# Patient Record
Sex: Female | Born: 1980 | Race: White | Hispanic: No | Smoking: Former smoker
Health system: Southern US, Community
[De-identification: ages and names within clinical notes are randomized; demographics above are authoritative.]

## PROBLEM LIST (undated history)

## (undated) DIAGNOSIS — F410 Panic disorder [episodic paroxysmal anxiety] without agoraphobia: Secondary | ICD-10-CM

## (undated) DIAGNOSIS — F988 Other specified behavioral and emotional disorders with onset usually occurring in childhood and adolescence: Secondary | ICD-10-CM

## (undated) HISTORY — PX: FOOT SURGERY: SHX648

---

## 2014-01-02 ENCOUNTER — Emergency Department (HOSPITAL_COMMUNITY)
Admission: EM | Admit: 2014-01-02 | Discharge: 2014-01-02 | Disposition: A | Discharge status: Home / Self Care | Payer: Self-pay | Attending: Emergency Medicine | Admitting: Emergency Medicine

## 2014-01-02 ENCOUNTER — Emergency Department (HOSPITAL_COMMUNITY): Payer: Self-pay

## 2014-01-02 ENCOUNTER — Encounter (HOSPITAL_COMMUNITY): Payer: Self-pay | Admitting: Emergency Medicine

## 2014-01-02 DIAGNOSIS — F411 Generalized anxiety disorder: Secondary | ICD-10-CM | POA: Insufficient documentation

## 2014-01-02 DIAGNOSIS — F419 Anxiety disorder, unspecified: Secondary | ICD-10-CM

## 2014-01-02 DIAGNOSIS — Z3202 Encounter for pregnancy test, result negative: Secondary | ICD-10-CM | POA: Insufficient documentation

## 2014-01-02 DIAGNOSIS — Z792 Long term (current) use of antibiotics: Secondary | ICD-10-CM | POA: Insufficient documentation

## 2014-01-02 DIAGNOSIS — Z87891 Personal history of nicotine dependence: Secondary | ICD-10-CM | POA: Insufficient documentation

## 2014-01-02 DIAGNOSIS — Z791 Long term (current) use of non-steroidal anti-inflammatories (NSAID): Secondary | ICD-10-CM | POA: Insufficient documentation

## 2014-01-02 DIAGNOSIS — R109 Unspecified abdominal pain: Secondary | ICD-10-CM | POA: Insufficient documentation

## 2014-01-02 HISTORY — DX: Panic disorder (episodic paroxysmal anxiety): F41.0

## 2014-01-02 HISTORY — DX: Other specified behavioral and emotional disorders with onset usually occurring in childhood and adolescence: F98.8

## 2014-01-02 LAB — HEPATIC FUNCTION PANEL
ALK PHOS: 59 U/L (ref 39–117)
ALT: 11 U/L (ref 0–35)
AST: 17 U/L (ref 0–37)
Albumin: 4.7 g/dL (ref 3.5–5.2)
BILIRUBIN TOTAL: 0.6 mg/dL (ref 0.3–1.2)
Bilirubin, Direct: <0.2 mg/dL (ref 0.0–0.3)
Total Protein: 7.9 g/dL (ref 6.0–8.3)

## 2014-01-02 LAB — CBC WITH DIFFERENTIAL/PLATELET
BASOS ABS: 0 10*3/uL (ref 0.0–0.1)
Basophils Relative: 0 % (ref 0–1)
Eosinophils Absolute: 0.1 10*3/uL (ref 0.0–0.7)
Eosinophils Relative: 1 % (ref 0–5)
HCT: 44.2 % (ref 36.0–46.0)
Hemoglobin: 15.1 g/dL — ABNORMAL HIGH (ref 12.0–15.0)
Lymphocytes Relative: 18 % (ref 12–46)
Lymphs Abs: 1.3 10*3/uL (ref 0.7–4.0)
MCH: 29 pg (ref 26.0–34.0)
MCHC: 34.2 g/dL (ref 30.0–36.0)
MCV: 84.8 fL (ref 78.0–100.0)
Monocytes Absolute: 0.4 10*3/uL (ref 0.1–1.0)
Monocytes Relative: 6 % (ref 3–12)
Neutro Abs: 5.6 10*3/uL (ref 1.7–7.7)
Neutrophils Relative %: 75 % (ref 43–77)
PLATELETS: ADEQUATE 10*3/uL (ref 150–400)
RBC: 5.21 MIL/uL — ABNORMAL HIGH (ref 3.87–5.11)
RDW: 12.6 % (ref 11.5–15.5)
WBC: 7.4 10*3/uL (ref 4.0–10.5)

## 2014-01-02 LAB — LIPASE, BLOOD: Lipase: 19 U/L (ref 11–59)

## 2014-01-02 LAB — URINALYSIS, ROUTINE W REFLEX MICROSCOPIC
Glucose, UA: NEGATIVE mg/dL
Hgb urine dipstick: NEGATIVE
KETONES UR: 40 mg/dL — AB
Leukocytes, UA: NEGATIVE
NITRITE: NEGATIVE
PROTEIN: NEGATIVE mg/dL
Specific Gravity, Urine: 1.015 (ref 1.005–1.030)
UROBILINOGEN UA: 1 mg/dL (ref 0.0–1.0)
pH: 8.5 — ABNORMAL HIGH (ref 5.0–8.0)

## 2014-01-02 LAB — BASIC METABOLIC PANEL
ANION GAP: 13 (ref 5–15)
BUN: 10 mg/dL (ref 6–23)
CO2: 24 mEq/L (ref 19–32)
CREATININE: 0.78 mg/dL (ref 0.50–1.10)
Calcium: 9.7 mg/dL (ref 8.4–10.5)
Chloride: 103 mEq/L (ref 96–112)
GFR calc Af Amer: >90 mL/min (ref >90)
GFR calc non Af Amer: >90 mL/min (ref >90)
Glucose, Bld: 126 mg/dL — ABNORMAL HIGH (ref 70–99)
Potassium: 4.1 mEq/L (ref 3.7–5.3)
Sodium: 140 mEq/L (ref 137–147)

## 2014-01-02 LAB — PREGNANCY, URINE: Preg Test, Ur: NEGATIVE

## 2014-01-02 MED ORDER — ALPRAZOLAM 0.25 MG PO TABS: 0.2500 mg | ORAL_TABLET | Freq: Two times a day (BID) | ORAL | Status: AC | PRN

## 2014-01-02 MED ORDER — SUCRALFATE 1 G PO TABS: 1.0000 g | ORAL_TABLET | Freq: Four times a day (QID) | ORAL | Status: AC

## 2014-01-02 MED ORDER — SODIUM CHLORIDE 0.9 % IV BOLUS (SEPSIS): 1000.0000 mL | Freq: Once | INTRAVENOUS | Status: DC

## 2014-01-02 MED ORDER — SUCRALFATE 1 G PO TABS: 1.0000 g | ORAL_TABLET | Freq: Four times a day (QID) | ORAL | Status: DC

## 2014-01-02 MED ORDER — SODIUM CHLORIDE 0.9 % IV SOLN
INTRAVENOUS | Status: DC
Start: 2014-01-02 — End: 2014-01-02

## 2014-01-02 MED ORDER — ALPRAZOLAM 0.25 MG PO TABS: 0.2500 mg | ORAL_TABLET | Freq: Two times a day (BID) | ORAL | Status: DC | PRN

## 2014-01-02 NOTE — ED Notes (Signed)
Pt anxious, stated that she wanted her blood drawn but no IV at this time. Has Pillow and teddy bear with her

## 2014-01-02 NOTE — ED Notes (Addendum)
Pt very anxious - questioning why IV needed to be started, requesting CT head be performed as she has intermittent Headaches, and lots of sinus drainage. Discussed with PA Freida BusmanAllen , instructed to hold off on starting IV that he would go into room to talk to pt .

## 2014-01-02 NOTE — Discharge Instructions (Signed)
Abdominal Pain, Women °Abdominal (stomach, pelvic, or belly) pain can be caused by many things. It is important to tell your doctor: °· The location of the pain. °· Does it come and go or is it present all the time? °· Are there things that start the pain (eating certain foods, exercise)? °· Are there other symptoms associated with the pain (fever, nausea, vomiting, diarrhea)? °All of this is helpful to know when trying to find the cause of the pain. °CAUSES  °· Stomach: virus or bacteria infection, or ulcer. °· Intestine: appendicitis (inflamed appendix), regional ileitis (Crohn's disease), ulcerative colitis (inflamed colon), irritable bowel syndrome, diverticulitis (inflamed diverticulum of the colon), or cancer of the stomach or intestine. °· Gallbladder disease or stones in the gallbladder. °· Kidney disease, kidney stones, or infection. °· Pancreas infection or cancer. °· Fibromyalgia (pain disorder). °· Diseases of the female organs: °¨ Uterus: fibroid (non-cancerous) tumors or infection. °¨ Fallopian tubes: infection or tubal pregnancy. °¨ Ovary: cysts or tumors. °¨ Pelvic adhesions (scar tissue). °¨ Endometriosis (uterus lining tissue growing in the pelvis and on the pelvic organs). °¨ Pelvic congestion syndrome (female organs filling up with blood just before the menstrual period). °¨ Pain with the menstrual period. °¨ Pain with ovulation (producing an egg). °¨ Pain with an IUD (intrauterine device, birth control) in the uterus. °¨ Cancer of the female organs. °· Functional pain (pain not caused by a disease, may improve without treatment). °· Psychological pain. °· Depression. °DIAGNOSIS  °Your doctor will decide the seriousness of your pain by doing an examination. °· Blood tests. °· X-rays. °· Ultrasound. °· CT scan (computed tomography, special type of X-ray). °· MRI (magnetic resonance imaging). °· Cultures, for infection. °· Barium enema (dye inserted in the large intestine, to better view it with  X-rays). °· Colonoscopy (looking in intestine with a lighted tube). °· Laparoscopy (minor surgery, looking in abdomen with a lighted tube). °· Major abdominal exploratory surgery (looking in abdomen with a large incision). °TREATMENT  °The treatment will depend on the cause of the pain.  °· Many cases can be observed and treated at home. °· Over-the-counter medicines recommended by your caregiver. °· Prescription medicine. °· Antibiotics, for infection. °· Birth control pills, for painful periods or for ovulation pain. °· Hormone treatment, for endometriosis. °· Nerve blocking injections. °· Physical therapy. °· Antidepressants. °· Counseling with a psychologist or psychiatrist. °· Minor or major surgery. °HOME CARE INSTRUCTIONS  °· Do not take laxatives, unless directed by your caregiver. °· Take over-the-counter pain medicine only if ordered by your caregiver. Do not take aspirin because it can cause an upset stomach or bleeding. °· Try a clear liquid diet (broth or water) as ordered by your caregiver. Slowly move to a bland diet, as tolerated, if the pain is related to the stomach or intestine. °· Have a thermometer and take your temperature several times a day, and record it. °· Bed rest and sleep, if it helps the pain. °· Avoid sexual intercourse, if it causes pain. °· Avoid stressful situations. °· Keep your follow-up appointments and tests, as your caregiver orders. °· If the pain does not go away with medicine or surgery, you may try: °¨ Acupuncture. °¨ Relaxation exercises (yoga, meditation). °¨ Group therapy. °¨ Counseling. °SEEK MEDICAL CARE IF:  °· You notice certain foods cause stomach pain. °· Your home care treatment is not helping your pain. °· You need stronger pain medicine. °· You want your IUD removed. °· You feel faint or   lightheaded.  You develop nausea and vomiting.  You develop a rash.  You are having side effects or an allergy to your medicine. SEEK IMMEDIATE MEDICAL CARE IF:   Your  pain does not go away or gets worse.  You have a fever.  Your pain is felt only in portions of the abdomen. The right side could possibly be appendicitis. The left lower portion of the abdomen could be colitis or diverticulitis.  You are passing blood in your stools (bright red or black tarry stools, with or without vomiting).  You have blood in your urine.  You develop chills, with or without a fever.  You pass out. MAKE SURE YOU:   Understand these instructions.  Will watch your condition.  Will get help right away if you are not doing well or get worse. Document Released: 04/11/2007 Document Revised: 09/06/2011 Document Reviewed: 05/01/2009 [facility] Patient Information 2015 [place], [place]. This information is not intended to replace advice given to you by your health care provider. Make sure you discuss any questions you have with your health care provider. Generalized Anxiety Disorder Generalized anxiety disorder (GAD) is a mental disorder. It interferes with life functions, including relationships, work, and school. GAD is different from normal anxiety, which everyone experiences at some point in their lives in response to specific life events and activities. Normal anxiety actually helps us prepare for and get through these life events and activities. Normal anxiety goes away after the event or activity is over.  GAD causes anxiety that is not necessarily related to specific events or activities. It also causes excess anxiety in proportion to specific events or activities. The anxiety associated with GAD is also difficult to control. GAD can vary from mild to severe. People with severe GAD can have intense waves of anxiety with physical symptoms (panic attacks).  SYMPTOMS The anxiety and worry associated with GAD are difficult to control. This anxiety and worry are related to many life events and activities and also occur more days than not for 6 months or longer. People with GAD  also have three or more of the following symptoms (one or more in children):  Restlessness.   Fatigue.  Difficulty concentrating.   Irritability.  Muscle tension.  Difficulty sleeping or unsatisfying sleep. DIAGNOSIS GAD is diagnosed through an assessment by your caregiver. Your caregiver will ask you questions aboutyour mood,physical symptoms, and events in your life. Your caregiver may ask you about your medical history and use of alcohol or drugs, including prescription medications. Your caregiver may also do a physical exam and blood tests. Certain medical conditions and the use of certain substances can cause symptoms similar to those associated with GAD. Your caregiver may refer you to a mental health specialist for further evaluation. TREATMENT The following therapies are usually used to treat GAD:   Medication--Antidepressant medication usually is prescribed for long-term daily control. Antianxiety medications may be added in severe cases, especially when panic attacks occur.   Talk therapy (psychotherapy)--Certain types of talk therapy can be helpful in treating GAD by providing support, education, and guidance. A form of talk therapy called cognitive behavioral therapy can teach you healthy ways to think about and react to daily life events and activities.  Stress managementtechniques--These include yoga, meditation, and exercise and can be very helpful when they are practiced regularly. A mental health specialist can help determine which treatment is best for you. Some people see improvement with one therapy. However, other people require a combination of therapies. Document  Released: 10/09/2012 Document Reviewed: 10/09/2012 [facility] Patient Information 2015 [place], [place]. This information is not intended to replace advice given to you by your health care provider. Make sure you discuss any questions you have with your health care provider.

## 2014-01-02 NOTE — ED Notes (Signed)
Pt c/o lower abd pain that gets worse with eating for the past week.  Reports nausea and diarrhea.  Pt reports " I panic a lot about everything" and reports gets very nervous and diaphoretic with the pain at times.

## 2014-01-02 NOTE — ED Provider Notes (Signed)
CSN: [id number]     Arrival date & time 01/02/14  1619 History   First MD Initiated Contact with Patient 01/02/14 1710     Chief Complaint  Patient presents with  . Abdominal Pain     (Consider location/radiation/quality/duration/timing/severity/associated sxs/prior Treatment) Patient is a 33 y.o. female presenting with abdominal pain. The history is provided by the patient.  Abdominal Pain  patient here with multiple complaints of abdominal discomfort with nausea and diarrhea. Patient admits to increased anxiety. No suicidal or homicidal ideations. Denies any urine symptoms. No dysuria hematuria. No anginal type pain. She does have increased flatus which makes her symptoms better. No treatment used prior to arrival. Doesn't take any medication for anxiety at this time. States he has had bilateral hand tingling when she becomes worried about her symptoms. Denies any leg pain or swelling.  Past Medical History  Diagnosis Date  . Panic attacks   . ADD (attention deficit disorder)    Past Surgical History  Procedure Laterality Date  . Foot surgery     No family history on file. History  Substance Use Topics  . Smoking status: Former Games developermoker  . Smokeless tobacco: Not on file  . Alcohol Use: No     Comment: former heavy drinker   OB History   Grav Para Term Preterm Abortions TAB SAB Ect Mult Living                 Review of Systems  Gastrointestinal: Positive for abdominal pain.  All other systems reviewed and are negative.     Allergies  Review of patient's allergies indicates no known allergies.  Home Medications   Prior to Admission medications   Medication Sig Start Date End Date Taking? Authorizing Provider  ibuprofen (ADVIL,MOTRIN) 600 MG tablet Take 600 mg by mouth 2 (two) times daily as needed for moderate pain.   Yes Historical Provider, MD  nitrofurantoin, macrocrystal-monohydrate, (MACROBID) 100 MG capsule Take 100 mg by mouth 2 (two) times daily.   Yes  Historical Provider, MD   BP 106/72  Pulse 90  Temp(Src) 97.9 F (36.6 C) (Oral)  Resp 20  Ht 5\' 1"  (1.549 m)  Wt 144 lb 6 oz (65.488 kg)  BMI 27.29 kg/m2  SpO2 98%  LMP 12/19/2013 Physical Exam  Nursing note and vitals reviewed. Constitutional: She is oriented to person, place, and time. She appears well-developed and well-nourished.  Non-toxic appearance. No distress.  HENT:  Head: Normocephalic and atraumatic.  Eyes: Conjunctivae, EOM and lids are normal. Pupils are equal, round, and reactive to light.  Neck: Normal range of motion. Neck supple. No tracheal deviation present. No mass present.  Cardiovascular: Normal rate, regular rhythm and normal heart sounds.  Exam reveals no gallop.   No murmur heard. Pulmonary/Chest: Effort normal and breath sounds normal. No stridor. No respiratory distress. She has no decreased breath sounds. She has no wheezes. She has no rhonchi. She has no rales.  Abdominal: Soft. Normal appearance and bowel sounds are normal. She exhibits no distension. There is no tenderness. There is no rigidity, no rebound, no guarding and no CVA tenderness.  Musculoskeletal: Normal range of motion. She exhibits no edema and no tenderness.  Neurological: She is alert and oriented to person, place, and time. She has normal strength. No cranial nerve deficit or sensory deficit. GCS eye subscore is 4. GCS verbal subscore is 5. GCS motor subscore is 6.  Skin: Skin is warm and dry. No abrasion and no rash noted.  Psychiatric: Her mood appears anxious. Her speech is rapid and/or pressured. She is hyperactive.    ED Course  Procedures (including critical care time) Labs Review Labs Reviewed  URINALYSIS, ROUTINE W REFLEX MICROSCOPIC - Abnormal; Notable for the following:    pH 8.5 (*)    Bilirubin Urine SMALL (*)    Ketones, ur 40 (*)    All other components within normal limits  CBC WITH DIFFERENTIAL - Abnormal; Notable for the following:    RBC 5.21 (*)    Hemoglobin  15.1 (*)    All other components within normal limits  BASIC METABOLIC PANEL - Abnormal; Notable for the following:    Glucose, Bld 126 (*)    All other components within normal limits  PREGNANCY, URINE  LIPASE, BLOOD  HEPATIC FUNCTION PANEL    Imaging Review No results found.   EKG Interpretation   Date/Time:  Wednesday January 02 2014 16:29:13 EDT Ventricular Rate:  68 PR Interval:  134 QRS Duration: 82 QT Interval:  404 QTC Calculation: 429 R Axis:   85 Text Interpretation:  Normal sinus rhythm with sinus arrhythmia Normal ECG  Confirmed by Norma Montemurro  MD, Traeson Dusza ([id number]) on 01/02/2014 5:27:54 PM      MDM   Final diagnoses:  None    Patient's blood work reassuring here. She is very anxious. Will prescribe Xanax and Carafate and discharge    Toy BakerAnthony T Mayela Bullard, MD 01/02/14 1857

## 2015-02-14 IMAGING — CR DG CHEST 2V
2 series · 2 of 2 positions shown · non-contrast
Comparison: None.

CLINICAL DATA: Shortness of breath.  Chest pain.

EXAM:
CHEST  2 VIEW

[view not recorded (1 of 2)]
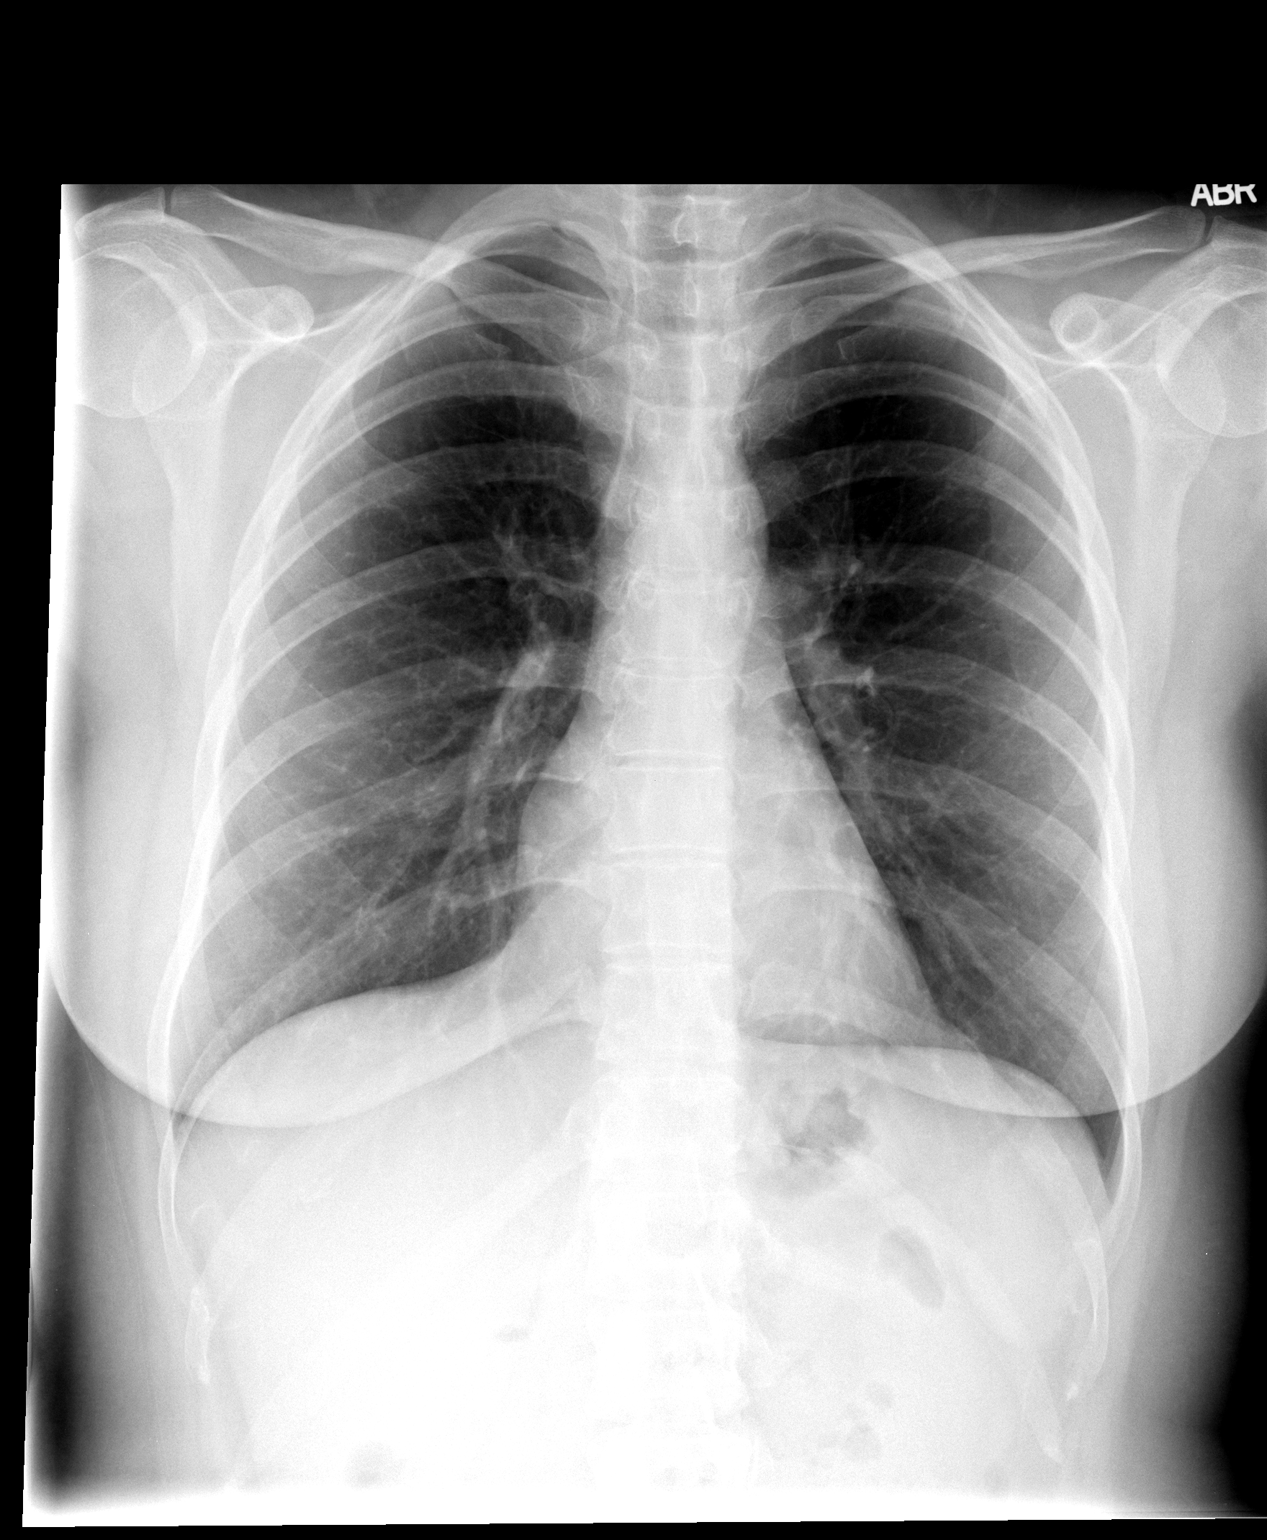

[view not recorded (2 of 2)]
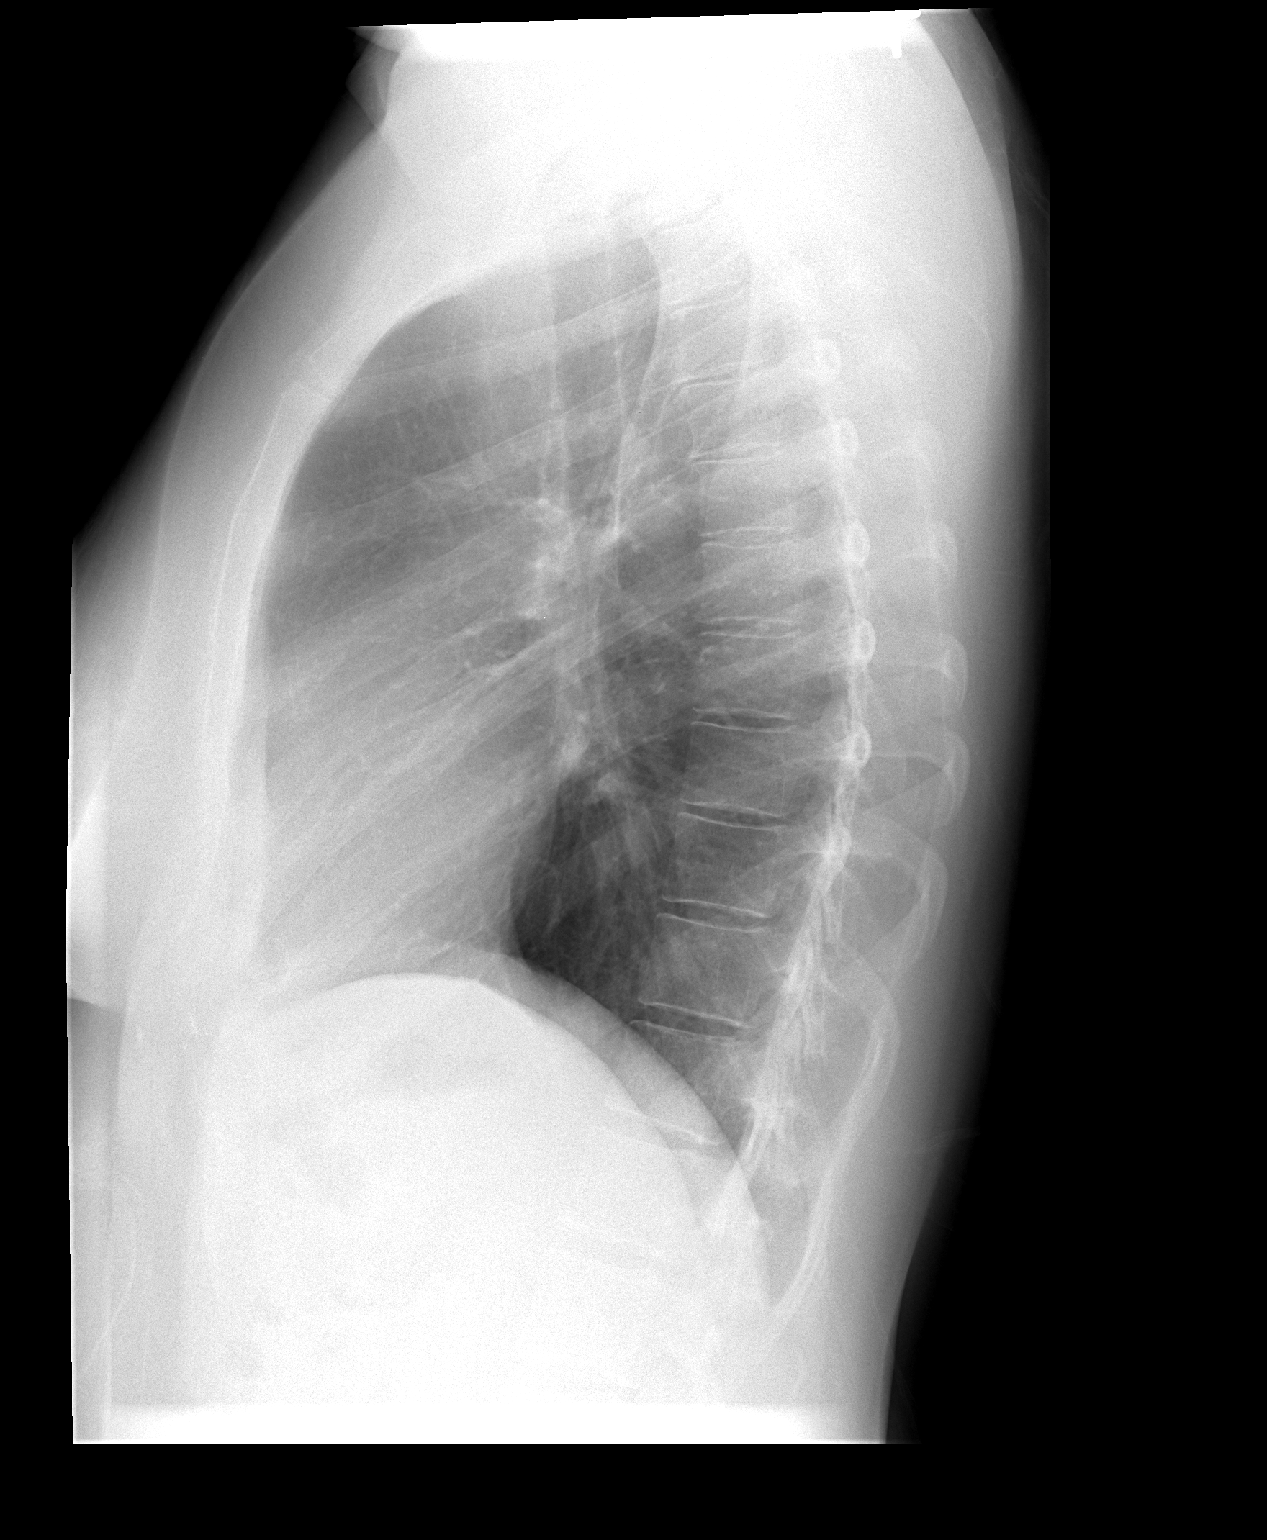

[2 of 2 positions shown; findings below may reference images not displayed]

FINDINGS: The heart size and mediastinal contours are within normal limits.
Both lungs are clear. The visualized skeletal structures are
unremarkable.
IMPRESSION: No active cardiopulmonary disease.
